# Patient Record
Sex: Male | Born: 2005 | Race: White | Hispanic: No | Marital: Single | Smoking: Never smoker
Health system: Southern US, Community
[De-identification: ages and names within clinical notes are randomized; demographics above are authoritative.]

## PROBLEM LIST (undated history)

## (undated) DIAGNOSIS — N3944 Nocturnal enuresis: Secondary | ICD-10-CM

## (undated) DIAGNOSIS — R109 Unspecified abdominal pain: Secondary | ICD-10-CM

## (undated) DIAGNOSIS — R112 Nausea with vomiting, unspecified: Secondary | ICD-10-CM

## (undated) DIAGNOSIS — F909 Attention-deficit hyperactivity disorder, unspecified type: Secondary | ICD-10-CM

## (undated) HISTORY — DX: Attention-deficit hyperactivity disorder, unspecified type: F90.9

## (undated) HISTORY — DX: Nausea with vomiting, unspecified: R11.2

## (undated) HISTORY — DX: Unspecified abdominal pain: R10.9

## (undated) HISTORY — PX: OTHER SURGICAL HISTORY: SHX169

## (undated) HISTORY — DX: Nocturnal enuresis: N39.44

## (undated) HISTORY — PX: FRENULECTOMY, LINGUAL: SHX1681

---
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
## 2023-01-03 ENCOUNTER — Other Ambulatory Visit (HOSPITAL_COMMUNITY): Payer: Self-pay

## 2023-02-28 ENCOUNTER — Other Ambulatory Visit: Payer: Self-pay

## 2023-02-28 ENCOUNTER — Other Ambulatory Visit (HOSPITAL_COMMUNITY): Payer: Self-pay

## 2023-02-28 MED ORDER — LEVOCETIRIZINE DIHYDROCHLORIDE 5 MG PO TABS
5.0000 mg | ORAL_TABLET | Freq: Every day | ORAL | 3 refills | Status: AC
  Filled 2023-02-28: qty 90, 90d supply, fill #0
  Filled 2023-05-23: qty 90, 90d supply, fill #1
  Filled 2023-08-16: qty 90, 90d supply, fill #2

## 2023-02-28 MED ORDER — CLINDAMYCIN HCL 300 MG PO CAPS
300.0000 mg | ORAL_CAPSULE | Freq: Four times a day (QID) | ORAL | 1 refills | Status: AC
  Filled 2023-02-28: qty 56, 14d supply, fill #0
  Filled 2023-04-18: qty 56, 14d supply, fill #1

## 2023-02-28 MED ORDER — MONTELUKAST SODIUM 10 MG PO TABS
10.0000 mg | ORAL_TABLET | Freq: Every day | ORAL | 3 refills | Status: AC
  Filled 2023-02-28: qty 90, 90d supply, fill #0
  Filled 2023-05-23: qty 90, 90d supply, fill #1
  Filled 2023-08-16: qty 90, 90d supply, fill #2

## 2023-02-28 MED ORDER — MUPIROCIN 2 % EX OINT
TOPICAL_OINTMENT | Freq: Three times a day (TID) | CUTANEOUS | 2 refills | Status: AC
  Filled 2023-02-28: qty 22, 14d supply, fill #0

## 2023-04-18 ENCOUNTER — Other Ambulatory Visit: Payer: Self-pay

## 2023-04-18 ENCOUNTER — Other Ambulatory Visit (HOSPITAL_COMMUNITY): Payer: Self-pay

## 2023-05-24 ENCOUNTER — Other Ambulatory Visit: Payer: Self-pay

## 2023-08-16 ENCOUNTER — Other Ambulatory Visit (HOSPITAL_COMMUNITY): Payer: Self-pay

## 2023-09-19 ENCOUNTER — Other Ambulatory Visit (HOSPITAL_COMMUNITY): Payer: Self-pay

## 2024-04-09 DIAGNOSIS — Z1331 Encounter for screening for depression: Secondary | ICD-10-CM | POA: Diagnosis not present

## 2024-04-09 DIAGNOSIS — Z0000 Encounter for general adult medical examination without abnormal findings: Secondary | ICD-10-CM | POA: Diagnosis not present

## 2024-04-09 DIAGNOSIS — Z6827 Body mass index (BMI) 27.0-27.9, adult: Secondary | ICD-10-CM | POA: Diagnosis not present

## 2024-04-09 DIAGNOSIS — Z68.41 Body mass index (BMI) pediatric, 85th percentile to less than 95th percentile for age: Secondary | ICD-10-CM | POA: Diagnosis not present

## 2024-04-09 DIAGNOSIS — L0501 Pilonidal cyst with abscess: Secondary | ICD-10-CM | POA: Diagnosis not present

## 2024-04-09 DIAGNOSIS — J302 Other seasonal allergic rhinitis: Secondary | ICD-10-CM | POA: Diagnosis not present

## 2024-04-09 DIAGNOSIS — R7309 Other abnormal glucose: Secondary | ICD-10-CM | POA: Diagnosis not present
